# Patient Record
Sex: Male | Born: 2005 | Race: Black or African American | Hispanic: No | Marital: Single | Smoking: Never smoker
Health system: Southern US, Community
[De-identification: ages and names within clinical notes are randomized; demographics above are authoritative.]

## PROBLEM LIST (undated history)

## (undated) DIAGNOSIS — J45909 Unspecified asthma, uncomplicated: Secondary | ICD-10-CM

## (undated) DIAGNOSIS — J302 Other seasonal allergic rhinitis: Secondary | ICD-10-CM

---
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
## 2023-03-02 ENCOUNTER — Ambulatory Visit: Payer: Medicaid Other | Admitting: Allergy

## 2023-03-13 ENCOUNTER — Ambulatory Visit: Payer: Medicaid Other | Admitting: Allergy

## 2023-03-13 NOTE — Progress Notes (Deleted)
New Patient Note  RE: Nathan Bennett MRN: [id number] DOB: 07-17-2006 Date of Office Visit: 03/13/2023  Consult requested by: Alma Downs, MD Primary care provider: Alma Downs, MD  Chief Complaint: No chief complaint on file.  History of Present Illness: I had the pleasure of seeing Nathan Bennett for initial evaluation at the [facility] on 03/13/2023. He is a 17 y.o. male, who is referred here by Alma Downs, MD for the evaluation of allergic rhinitis.  He reports symptoms of ***. Symptoms have been going on for *** years. The symptoms are present *** all year around with worsening in ***. Other triggers include exposure to ***. Anosmia: ***. Headache: ***. He has used *** with ***fair improvement in symptoms. Sinus infections: ***. Previous work up includes: ***. Previous ENT evaluation: ***. Previous sinus imaging: ***. History of nasal polyps: ***. Last eye exam: ***. History of reflux: ***.  Assessment and Plan: Srihith is a 17 y.o. male with: No problem-specific Assessment & Plan notes found for this encounter.  No follow-ups on file.  No orders of the defined types were placed in this encounter.  Lab Orders  No laboratory test(s) ordered today    Other allergy screening: Asthma: {Blank single:19197::"yes","no"} Rhino conjunctivitis: {Blank single:19197::"yes","no"} Food allergy: {Blank single:19197::"yes","no"} Medication allergy: {Blank single:19197::"yes","no"} Hymenoptera allergy: {Blank single:19197::"yes","no"} Urticaria: {Blank single:19197::"yes","no"} Eczema:{Blank single:19197::"yes","no"} History of recurrent infections suggestive of immunodeficency: {Blank single:19197::"yes","no"}  Diagnostics: Spirometry:  Tracings reviewed. His effort: {Blank single:19197::"Good reproducible efforts.","It was hard to get consistent efforts and there is a question as to whether this reflects a maximal maneuver.","Poor effort, data can not be  interpreted."} FVC: ***L FEV1: ***L, ***% predicted FEV1/FVC ratio: ***% Interpretation: {Blank single:19197::"Spirometry consistent with mild obstructive disease","Spirometry consistent with moderate obstructive disease","Spirometry consistent with severe obstructive disease","Spirometry consistent with possible restrictive disease","Spirometry consistent with mixed obstructive and restrictive disease","Spirometry uninterpretable due to technique","Spirometry consistent with normal pattern","No overt abnormalities noted given today's efforts"}.  Please see scanned spirometry results for details.  Skin Testing: {Blank single:19197::"Select foods","Environmental allergy panel","Environmental allergy panel and select foods","Food allergy panel","None","Deferred due to recent antihistamines use"}. *** Results discussed with patient/family.   Past Medical History: There are no problems to display for this patient.  Past Medical History:  Diagnosis Date  . Asthma   . Seasonal allergies    Past Surgical History: No past surgical history on file. Medication List:  Current Outpatient Medications  Medication Sig Dispense Refill  . beclomethasone (QVAR) 80 MCG/ACT inhaler Inhale 2 puffs into the lungs 2 (two) times daily. 1 Inhaler 0  . cetirizine (ZYRTEC) 10 MG chewable tablet Chew 1 tablet (10 mg total) by mouth daily as needed for allergies.    Marland Kitchen metoCLOPramide (REGLAN) 10 MG tablet Take 1 tablet (10 mg total) by mouth every 6 (six) hours as needed for nausea (nausea/headache). (Patient not taking: Reported on 01/10/2022) 6 tablet 0  . montelukast (SINGULAIR) 5 MG chewable tablet Chew 1 tablet (5 mg total) by mouth at bedtime. 30 tablet 0  . predniSONE (DELTASONE) 10 MG tablet Take 4 tablets (40 mg total) by mouth daily. (Patient not taking: Reported on 01/10/2022) 16 tablet 0   No current facility-administered medications for this visit.   Allergies: No Known Allergies Social  History: Social History   Socioeconomic History  . Marital status: Single    Spouse name: Not on file  . Number of children: Not on file  . Years of education: Not on file  . Highest education level:  Not on file  Occupational History  . Not on file  Tobacco Use  . Smoking status: Never  . Smokeless tobacco: Never  Substance and Sexual Activity  . Alcohol use: Never  . Drug use: Never  . Sexual activity: Not on file  Other Topics Concern  . Not on file  Social History Narrative   In 12th grade at [employer].    Social Determinants of Health   Financial Resource Strain: Not on file  Food Insecurity: Not on file  Transportation Needs: Not on file  Physical Activity: Not on file  Stress: Not on file  Social Connections: Not on file   Lives in a ***. Smoking: *** Occupation: ***  Environmental HistorySurveyor, minerals in the house: Copywriter, advertising in the family room: {Blank single:19197::"yes","no"} Carpet in the bedroom: {Blank single:19197::"yes","no"} Heating: {Blank single:19197::"electric","gas","heat pump"} Cooling: {Blank single:19197::"central","window","heat pump"} Pet: {Blank single:19197::"yes ***","no"}  Family History: Family History  Problem Relation Age of Onset  . Diabetes type II Father   . Diabetes Other    Problem                               Relation Asthma                                   *** Eczema                                *** Food allergy                          *** Allergic rhino conjunctivitis     ***  Review of Systems  Constitutional:  Negative for appetite change, chills, fever and unexpected weight change.  HENT:  Negative for congestion and rhinorrhea.   Eyes:  Negative for itching.  Respiratory:  Negative for cough, chest tightness, shortness of breath and wheezing.   Cardiovascular:  Negative for chest pain.  Gastrointestinal:  Negative for abdominal pain.  Genitourinary:  Negative for  difficulty urinating.  Skin:  Negative for rash.  Neurological:  Negative for headaches.   Objective: There were no vitals taken for this visit. There is no height or weight on file to calculate BMI. Physical Exam Vitals and nursing note reviewed.  Constitutional:      Appearance: Normal appearance. He is well-developed.  HENT:     Head: Normocephalic and atraumatic.     Right Ear: Tympanic membrane and external ear normal.     Left Ear: Tympanic membrane and external ear normal.     Nose: Nose normal.     Mouth/Throat:     Mouth: Mucous membranes are moist.     Pharynx: Oropharynx is clear.  Eyes:     Conjunctiva/sclera: Conjunctivae normal.  Cardiovascular:     Rate and Rhythm: Normal rate and regular rhythm.     Heart sounds: Normal heart sounds. No murmur heard.    No friction rub. No gallop.  Pulmonary:     Effort: Pulmonary effort is normal.     Breath sounds: Normal breath sounds. No wheezing, rhonchi or rales.  Musculoskeletal:     Cervical back: Neck supple.  Skin:    General: Skin is warm.     Findings: No rash.  Neurological:  Mental Status: He is alert and oriented to person, place, and time.  Psychiatric:        Behavior: Behavior normal.  The plan was reviewed with the patient/family, and all questions/concerned were addressed.  It was my pleasure to see Kennieth today and participate in his care. Please feel free to contact me with any questions or concerns.  Sincerely,  Wyline Mood, DO Allergy & Immunology  [facility] office: [phone] [facility] office: [phone]

## 2023-05-28 NOTE — Progress Notes (Deleted)
New Patient Note  RE: Nathan Bennett MRN: [id number] DOB: 01-03-2006 Date of Office Visit: 05/29/2023  Consult requested by: Alma Downs, MD Primary care provider: Alma Downs, MD  Chief Complaint: No chief complaint on file.  History of Present Illness: I had the pleasure of seeing Nathan Bennett for initial evaluation at the [facility] on 05/28/2023. He is a 17 y.o. male, who is referred here by Alma Downs, MD for the evaluation of rhinorrhea.  He reports symptoms of ***. Symptoms have been going on for *** years. The symptoms are present *** all year around with worsening in ***. Other triggers include exposure to ***. Anosmia: ***. Headache: ***. He has used *** with ***fair improvement in symptoms. Sinus infections: ***. Previous work up includes: ***. Previous ENT evaluation: ***. Previous sinus imaging: ***. History of nasal polyps: ***. Last eye exam: ***. History of reflux: ***.  Assessment and Plan: Nathan Bennett is a 17 y.o. male with: ***  No follow-ups on file.  No orders of the defined types were placed in this encounter.  Lab Orders  No laboratory test(s) ordered today    Other allergy screening: Asthma: {Blank single:19197::"yes","no"} Rhino conjunctivitis: {Blank single:19197::"yes","no"} Food allergy: {Blank single:19197::"yes","no"} Medication allergy: {Blank single:19197::"yes","no"} Hymenoptera allergy: {Blank single:19197::"yes","no"} Urticaria: {Blank single:19197::"yes","no"} Eczema:{Blank single:19197::"yes","no"} History of recurrent infections suggestive of immunodeficency: {Blank single:19197::"yes","no"}  Diagnostics: Spirometry:  Tracings reviewed. His effort: {Blank single:19197::"Good reproducible efforts.","It was hard to get consistent efforts and there is a question as to whether this reflects a maximal maneuver.","Poor effort, data can not be interpreted."} FVC: ***L FEV1: ***L, ***% predicted FEV1/FVC ratio:  ***% Interpretation: {Blank single:19197::"Spirometry consistent with mild obstructive disease","Spirometry consistent with moderate obstructive disease","Spirometry consistent with severe obstructive disease","Spirometry consistent with possible restrictive disease","Spirometry consistent with mixed obstructive and restrictive disease","Spirometry uninterpretable due to technique","Spirometry consistent with normal pattern","No overt abnormalities noted given today's efforts"}.  Please see scanned spirometry results for details.  Skin Testing: {Blank single:19197::"Select foods","Environmental allergy panel","Environmental allergy panel and select foods","Food allergy panel","None","Deferred due to recent antihistamines use"}. *** Results discussed with patient/family.   Past Medical History: There are no problems to display for this patient.  Past Medical History:  Diagnosis Date  . Asthma   . Seasonal allergies    Past Surgical History: No past surgical history on file. Medication List:  Current Outpatient Medications  Medication Sig Dispense Refill  . beclomethasone (QVAR) 80 MCG/ACT inhaler Inhale 2 puffs into the lungs 2 (two) times daily. 1 Inhaler 0  . cetirizine (ZYRTEC) 10 MG chewable tablet Chew 1 tablet (10 mg total) by mouth daily as needed for allergies.    Marland Kitchen metoCLOPramide (REGLAN) 10 MG tablet Take 1 tablet (10 mg total) by mouth every 6 (six) hours as needed for nausea (nausea/headache). (Patient not taking: Reported on 01/10/2022) 6 tablet 0  . montelukast (SINGULAIR) 5 MG chewable tablet Chew 1 tablet (5 mg total) by mouth at bedtime. 30 tablet 0  . predniSONE (DELTASONE) 10 MG tablet Take 4 tablets (40 mg total) by mouth daily. (Patient not taking: Reported on 01/10/2022) 16 tablet 0   No current facility-administered medications for this visit.   Allergies: No Known Allergies Social History: Social History   Socioeconomic History  . Marital status: Single     Spouse name: Not on file  . Number of children: Not on file  . Years of education: Not on file  . Highest education level: Not on file  Occupational History  . Not on  file  Tobacco Use  . Smoking status: Never  . Smokeless tobacco: Never  Substance and Sexual Activity  . Alcohol use: Never  . Drug use: Never  . Sexual activity: Not on file  Other Topics Concern  . Not on file  Social History Narrative   In 12th grade at [employer].    Social Determinants of Health   Financial Resource Strain: Not on file  Food Insecurity: Not on file  Transportation Needs: Not on file  Physical Activity: Not on file  Stress: Not on file  Social Connections: Not on file   Lives in a ***. Smoking: *** Occupation: ***  Environmental HistorySurveyor, minerals in the house: Copywriter, advertising in the family room: {Blank single:19197::"yes","no"} Carpet in the bedroom: {Blank single:19197::"yes","no"} Heating: {Blank single:19197::"electric","gas","heat pump"} Cooling: {Blank single:19197::"central","window","heat pump"} Pet: {Blank single:19197::"yes ***","no"}  Family History: Family History  Problem Relation Age of Onset  . Diabetes type II Father   . Diabetes Other    Problem                               Relation Asthma                                   *** Eczema                                *** Food allergy                          *** Allergic rhino conjunctivitis     ***  Review of Systems  Constitutional:  Negative for appetite change, chills, fever and unexpected weight change.  HENT:  Negative for congestion and rhinorrhea.   Eyes:  Negative for itching.  Respiratory:  Negative for cough, chest tightness, shortness of breath and wheezing.   Cardiovascular:  Negative for chest pain.  Gastrointestinal:  Negative for abdominal pain.  Genitourinary:  Negative for difficulty urinating.  Skin:  Negative for rash.  Neurological:  Negative for  headaches.   Objective: There were no vitals taken for this visit. There is no height or weight on file to calculate BMI. Physical Exam Vitals and nursing note reviewed.  Constitutional:      Appearance: Normal appearance. He is well-developed.  HENT:     Head: Normocephalic and atraumatic.     Right Ear: Tympanic membrane and external ear normal.     Left Ear: Tympanic membrane and external ear normal.     Nose: Nose normal.     Mouth/Throat:     Mouth: Mucous membranes are moist.     Pharynx: Oropharynx is clear.  Eyes:     Conjunctiva/sclera: Conjunctivae normal.  Cardiovascular:     Rate and Rhythm: Normal rate and regular rhythm.     Heart sounds: Normal heart sounds. No murmur heard.    No friction rub. No gallop.  Pulmonary:     Effort: Pulmonary effort is normal.     Breath sounds: Normal breath sounds. No wheezing, rhonchi or rales.  Musculoskeletal:     Cervical back: Neck supple.  Skin:    General: Skin is warm.     Findings: No rash.  Neurological:     Mental Status: He is alert and oriented  to person, place, and time.  Psychiatric:        Behavior: Behavior normal.  The plan was reviewed with the patient/family, and all questions/concerned were addressed.  It was my pleasure to see Nathan Bennett today and participate in his care. Please feel free to contact me with any questions or concerns.  Sincerely,  Wyline Mood, DO Allergy & Immunology  [facility] office: [phone] [facility] office: [phone]

## 2023-05-29 ENCOUNTER — Encounter: Payer: Self-pay | Admitting: Internal Medicine

## 2023-05-29 ENCOUNTER — Ambulatory Visit: Payer: Medicaid Other | Admitting: Allergy

## 2023-05-29 ENCOUNTER — Ambulatory Visit (INDEPENDENT_AMBULATORY_CARE_PROVIDER_SITE_OTHER): Payer: Medicaid Other | Admitting: Internal Medicine

## 2023-05-29 VITALS — BP 102/66 | HR 68 | Temp 97.3°F | Resp 16 | Ht 68.0 in | Wt 115.7 lb

## 2023-05-29 DIAGNOSIS — J3089 Other allergic rhinitis: Secondary | ICD-10-CM

## 2023-05-29 DIAGNOSIS — J302 Other seasonal allergic rhinitis: Secondary | ICD-10-CM

## 2023-05-29 DIAGNOSIS — J4530 Mild persistent asthma, uncomplicated: Secondary | ICD-10-CM

## 2023-05-29 MED ORDER — MONTELUKAST SODIUM 10 MG PO TABS: 10.0000 mg | ORAL_TABLET | Freq: Every day | ORAL | 5 refills | Status: AC

## 2023-05-29 MED ORDER — CETIRIZINE HCL 10 MG PO TABS: 10.0000 mg | ORAL_TABLET | Freq: Every day | ORAL | 5 refills | Status: AC

## 2023-05-29 MED ORDER — FLUTICASONE PROPIONATE 50 MCG/ACT NA SUSP: 2.0000 | Freq: Every day | NASAL | 2 refills | Status: AC

## 2023-05-29 MED ORDER — AZELASTINE HCL 0.1 % NA SOLN: 2.0000 | Freq: Two times a day (BID) | NASAL | 12 refills | Status: AC

## 2023-05-29 NOTE — Patient Instructions (Signed)
Mild persistent asthma: Well controlled  - your lung testing today looked great!  PLAN:  - Spacer use reviewed. - Controller Inhaler: Continue Flovent 2 puffs once a day; This Should Be Used Everyday - Rinse mouth out after use - Rescue Inhaler: Albuterol (Proair/Ventolin) 2 puffs . Use  every 4-6 hours as needed for chest tightness, wheezing, or coughing.  Can also use 15 minutes prior to exercise if you have symptoms with activity. - Asthma is not controlled if:  - Symptoms are occurring >2 times a week OR  - >2 times a month nighttime awakenings  - You are requiring systemic steroids (prednisone/steroid injections) more than once per year  - Your require hospitalization for your asthma.  - Please call the clinic to schedule a follow up if these symptoms arise  Chronic Rhinitis Seasonal and Perennial Allergic: - allergy testing today: Grass, weed, tree, mold, dust mite, cat, dog, roach  - Prevention:  - allergen avoidance when possible - consider allergy shots as long term control of your symptoms by teaching your immune system to be more tolerant of your allergy triggers  - Symptom control: - Start Nasal Steroid Spray: Best results if used daily. - Options include Flonase (fluticasone), Nasocort (triamcinolone), Nasonex (mometasome) 1- 2 sprays in each nostril daily.  - All can be purchased over-the-counter if not covered by insurance. - Start Astelin (azelastine) 1-2 sprays in each nostril twice a day as needed for nasal congestion/itchy nose - Use less frequently if airway gets too dry. - Start Singulair (Montelukast) 10mg  nightly.   - Discontinue if nightmares of behavior changes. - Start Antihistamine: daily or daily as needed.   -Options include Zyrtec (Cetirizine) 10mg , Claritin (Loratadine) 10mg , Allegra (Fexofenadine) 180mg , or Xyzal (Levocetirinze) 5mg  - Can be purchased over-the-counter if not covered by insurance.  Follow up: 2 months   Thank you so much for letting  me partake in your care today.  Don't hesitate to reach out if you have any additional concerns!  Ferol Luz, MD  [facility] [place] [place]  Reducing Pollen Exposure  The American Academy of Allergy, Asthma and Immunology suggests the following steps to reduce your exposure to pollen during allergy seasons.    Do not hang sheets or clothing out to dry; pollen may collect on these items. Do not mow lawns or spend time around freshly cut grass; mowing stirs up pollen. Keep windows closed at night.  Keep car windows closed while driving. Minimize morning activities outdoors, a time when pollen counts are usually at their highest. Stay indoors as much as possible when pollen counts or humidity is high and on windy days when pollen tends to remain in the air longer. Use air conditioning when possible.  Many air conditioners have filters that trap the pollen spores. Use a HEPA room air filter to remove pollen form the indoor air you breathe.  DUST MITE AVOIDANCE MEASURES:  There are three main measures that need and can be taken to avoid house dust mites:  Reduce accumulation of dust in general -reduce furniture, clothing, carpeting, books, stuffed animals, especially in bedroom  Separate yourself from the dust -use pillow and mattress encasements (can be found at stores such as [employer] or online) -avoid direct exposure to air condition flow -use a HEPA filter device, especially in the bedroom; you can also use a HEPA filter vacuum cleaner -wipe dust with a moist towel instead of a dry towel or broom when cleaning  Decrease mites and/or their secretions -wash clothing and linen and stuffed animals at highest temperature possible, at least every 2 weeks -stuffed animals can also be placed in a bag and put in a freezer overnight  Despite the above measures, it is impossible to eliminate dust mites or their allergen completely from your home.  With the  above measures the burden of mites in your home can be diminished, with the goal of minimizing your allergic symptoms.  Success will be reached only when implementing and using all means together.  Control of Mold Allergen   Mold and fungi can grow on a variety of surfaces provided certain temperature and moisture conditions exist.  Outdoor molds grow on plants, decaying vegetation and soil.  The major outdoor mold, Alternaria and Cladosporium, are found in very high numbers during hot and dry conditions.  Generally, a late Summer - Fall peak is seen for common outdoor fungal spores.  Rain will temporarily lower outdoor mold spore count, but counts rise rapidly when the rainy period ends.  The most important indoor molds are Aspergillus and Penicillium.  Dark, humid and poorly ventilated basements are ideal sites for mold growth.  The next most common sites of mold growth are the bathroom and the kitchen.  Outdoor (Seasonal) Mold Control  Use air conditioning and keep windows closed Avoid exposure to decaying vegetation. Avoid leaf raking. Avoid grain handling. Consider wearing a face mask if working in moldy areas.    Indoor (Perennial) Mold Control   Maintain humidity below 50%. Clean washable surfaces with 5% bleach solution. Remove sources e.g. contaminated carpets.  Control of Cockroach Allergen  Cockroach allergen has been identified as an important cause of acute attacks of asthma, especially in urban settings.  There are fifty-five species of cockroach that exist in the [place], however only three, the [place], [place] species produce allergen that can affect patients with Asthma.  Allergens can be obtained from fecal particles, egg casings and secretions from cockroaches.    Remove food sources. Reduce access to water. Seal access and entry points. Spray runways with 0.5-1% Diazinon or Chlorpyrifos Blow boric acid power under stoves and refrigerator. Place  bait stations (hydramethylnon) at feeding sites.

## 2023-05-29 NOTE — Progress Notes (Signed)
New Patient Note  RE: Nathan Bennett MRN: [id number] DOB: 10-13-2005 Date of Office Visit: 05/29/2023  Consult requested by: Alma Downs, MD Primary care provider: Alma Downs, MD  Chief Complaint: Allergic Rhinitis  Marland KitchenAltamese Cabal nose and cough when indoors)  History of Present Illness: I had the pleasure of seeing Nathan Bennett for initial evaluation at the [facility] on 05/29/2023. He is a 17 y.o. male, who is referred here by Alma Downs, MD for the evaluation of chronic rhinitis .  History obtained from patient, chart review.  Chronic rhinitis: started 2 years ago  Symptoms include:  throat clearing cough, rhinorrhea, post nasal drainage, and sneezing  Occurs year-round with seasonal flares spring and summer Potential triggers: worse indoors, dogs, pollen, dust  Treatments tried: cetrizine, Careers adviser, flonase (inadequate trial, did not  tolerate),  Previous allergy testing: no History of reflux/heartburn: no History of chronic sinusitis or sinus surgery: no Nonallergic triggers: strong odors   Asthma:  Diagnosed at age as  a young child .  Current symptoms include chest tightness, cough, shortness of breath, and wheezing 0 daytime symptoms in past month, 0 nighttime awakenings in past month Using rescue inhaler 0 Limitations to daily activity: none 0 ED visits, 0 UC visits and 0 oral steroids in the past year 1 number of lifetime hospitalizations, 0 number of lifetime intubations.  Identified Triggers:  dust and heat  Prior PFTs or spirometry: none to review  Previously used therapies: albuterol, flovent 2 puffs daily .  Current regimen:  Maintenance: Flovent 2 puff daily  Rescue: Albuterol 2 puffs q4-6 hrs PRN, not using  prior to exercise  Up-to-date with pneumonia,, Covid-19,, and Flu, vaccines. History of prior pneumonias: no History of prior COVID-19/RVS/FLU  infection: no Smoking exposure: he is an active vaper, secondhand exposure at home       Assessment and Plan: Nathan Bennett is a 17 y.o. male with: Mild persistent asthma without complication - Plan: Spirometry with Graph  Seasonal and perennial allergic rhinitis - Plan: Allergy Test, Intradermal Allergy Test   Plan: Patient Instructions  Mild persistent asthma: Well controlled  - your lung testing today looked great!  PLAN:  - Spacer use reviewed. - Controller Inhaler: Continue Flovent 2 puffs once a day; This Should Be Used Everyday - Rinse mouth out after use - Rescue Inhaler: Albuterol (Proair/Ventolin) 2 puffs . Use  every 4-6 hours as needed for chest tightness, wheezing, or coughing.  Can also use 15 minutes prior to exercise if you have symptoms with activity. - Asthma is not controlled if:  - Symptoms are occurring >2 times a week OR  - >2 times a month nighttime awakenings  - You are requiring systemic steroids (prednisone/steroid injections) more than once per year  - Your require hospitalization for your asthma.  - Please call the clinic to schedule a follow up if these symptoms arise  Chronic Rhinitis Seasonal and Perennial Allergic: - allergy testing today: Grass, weed, tree, mold, dust mite, cat, dog, roach  - Prevention:  - allergen avoidance when possible - consider allergy shots as long term control of your symptoms by teaching your immune system to be more tolerant of your allergy triggers  - Symptom control: - Start Nasal Steroid Spray: Best results if used daily. - Options include Flonase (fluticasone), Nasocort (triamcinolone), Nasonex (mometasome) 1- 2 sprays in each nostril daily.  - All can be purchased over-the-counter if not covered by insurance. - Start Astelin (azelastine) 1-2 sprays in each  nostril twice a day as needed for nasal congestion/itchy nose - Use less frequently if airway gets too dry. - Start Singulair (Montelukast) 10mg  nightly.   - Discontinue if nightmares of behavior changes. - Start Antihistamine: daily or daily as  needed.   -Options include Zyrtec (Cetirizine) 10mg , Claritin (Loratadine) 10mg , Allegra (Fexofenadine) 180mg , or Xyzal (Levocetirinze) 5mg  - Can be purchased over-the-counter if not covered by insurance.  Follow up: 2 months   Thank you so much for letting me partake in your care today.  Don't hesitate to reach out if you have any additional concerns!  Nathan Luz, MD  [facility] [place] [place]  Reducing Pollen Exposure  The American Academy of Allergy, Asthma and Immunology suggests the following steps to reduce your exposure to pollen during allergy seasons.    Do not hang sheets or clothing out to dry; pollen may collect on these items. Do not mow lawns or spend time around freshly cut grass; mowing stirs up pollen. Keep windows closed at night.  Keep car windows closed while driving. Minimize morning activities outdoors, a time when pollen counts are usually at their highest. Stay indoors as much as possible when pollen counts or humidity is high and on windy days when pollen tends to remain in the air longer. Use air conditioning when possible.  Many air conditioners have filters that trap the pollen spores. Use a HEPA room air filter to remove pollen form the indoor air you breathe.  DUST MITE AVOIDANCE MEASURES:  There are three main measures that need and can be taken to avoid house dust mites:  Reduce accumulation of dust in general -reduce furniture, clothing, carpeting, books, stuffed animals, especially in bedroom  Separate yourself from the dust -use pillow and mattress encasements (can be found at stores such as [employer] or online) -avoid direct exposure to air condition flow -use a HEPA filter device, especially in the bedroom; you can also use a HEPA filter vacuum cleaner -wipe dust with a moist towel instead of a dry towel or broom when cleaning  Decrease mites and/or their secretions -wash clothing and linen and stuffed animals  at highest temperature possible, at least every 2 weeks -stuffed animals can also be placed in a bag and put in a freezer overnight  Despite the above measures, it is impossible to eliminate dust mites or their allergen completely from your home.  With the above measures the burden of mites in your home can be diminished, with the goal of minimizing your allergic symptoms.  Success will be reached only when implementing and using all means together.  Control of Mold Allergen   Mold and fungi can grow on a variety of surfaces provided certain temperature and moisture conditions exist.  Outdoor molds grow on plants, decaying vegetation and soil.  The major outdoor mold, Alternaria and Cladosporium, are found in very high numbers during hot and dry conditions.  Generally, a late Summer - Fall peak is seen for common outdoor fungal spores.  Rain will temporarily lower outdoor mold spore count, but counts rise rapidly when the rainy period ends.  The most important indoor molds are Aspergillus and Penicillium.  Dark, humid and poorly ventilated basements are ideal sites for mold growth.  The next most common sites of mold growth are the bathroom and the kitchen.  Outdoor (Seasonal) Mold Control  Use air conditioning and keep windows closed Avoid exposure to decaying vegetation. Avoid leaf raking. Avoid grain handling. Consider  wearing a face mask if working in moldy areas.    Indoor (Perennial) Mold Control   Maintain humidity below 50%. Clean washable surfaces with 5% bleach solution. Remove sources e.g. contaminated carpets.  Control of Cockroach Allergen  Cockroach allergen has been identified as an important cause of acute attacks of asthma, especially in urban settings.  There are fifty-five species of cockroach that exist in the [place], however only three, the [place], [place] species produce allergen that can affect patients with Asthma.  Allergens can be obtained from  fecal particles, egg casings and secretions from cockroaches.    Remove food sources. Reduce access to water. Seal access and entry points. Spray runways with 0.5-1% Diazinon or Chlorpyrifos Blow boric acid power under stoves and refrigerator. Place bait stations (hydramethylnon) at feeding sites.    Meds ordered this encounter  Medications   montelukast (SINGULAIR) 10 MG tablet    Sig: Take 1 tablet (10 mg total) by mouth at bedtime.    Dispense:  30 tablet    Refill:  5   fluticasone (FLONASE) 50 MCG/ACT nasal spray    Sig: Place 2 sprays into both nostrils daily.    Dispense:  16 g    Refill:  2   azelastine (ASTELIN) 0.1 % nasal spray    Sig: Place 2 sprays into both nostrils 2 (two) times daily. Use in each nostril as directed    Dispense:  30 mL    Refill:  12   cetirizine (ZYRTEC) 10 MG tablet    Sig: Take 1 tablet (10 mg total) by mouth daily.    Dispense:  30 tablet    Refill:  5   Lab Orders  No laboratory test(s) ordered today    Other allergy screening: Asthma: no Rhino conjunctivitis: yes Food allergy: no Medication allergy: no Hymenoptera allergy: no Urticaria: no Eczema:no History of recurrent infections suggestive of immunodeficency: no  Diagnostics: Spirometry:  Tracings reviewed. His effort: Good reproducible efforts. FVC: 3.74 L FEV1: 3.45 L, 106% predicted FEV1/FVC ratio: 92% Interpretation: Spirometry consistent with normal pattern.  Please see scanned spirometry results for details.  Skin Testing: Environmental allergy panel.  adequate controls  Results interpreted by myself and discussed with patient/family.  Airborne Adult Perc - 05/29/23 1032     Time Antigen Placed 1030    Allergen Manufacturer Greer    Location Back    Number of Test 55    1. Control-Buffer 50% Glycerol Negative    2. Control-Histamine 4+    3. Bahia 4+    4. [place] 4+    5. Johnson 4+    6. Kentucky Blue 4+    7. Meadow Fescue 4+    8. Perennial Rye 4+     9. Timothy 4+    10. Ragweed Mix Negative    11. Cocklebur Negative    12. Plantain,  English 2+    13. Baccharis 2+    14. Dog Fennel 2+    15. Gu[place] Thistle 2+    16. Lamb's Quarters 2+    17. Sheep Sorrell 2+    18. Rough Pigweed 2+    19. Marsh Elder, Rough 2+    20. Mugwort, Common 2+    21. Box, Elder 2+    22. Cedar, red 4+    23. Sweet Gum Negative    24. Pecan Pollen 4+    25. Pine Mix Negative    26. Walnut, Black Pollen 4+  27. Red Mulberry Negative    28. Ash Mix Negative    29. Birch Mix 4+    30. Beech American Negative    31. Cottonwood, [place] Negative    32. Hickory, White Negative    33. Maple Mix Negative    34. Oak, [place] Mix 4+    35. Sycamore Eastern 3+    36. Alternaria Alternata Negative    37. Cladosporium Herbarum Negative    38. Aspergillus Mix Negative    39. Penicillium Mix Negative    40. Bipolaris Sorokiniana (Helminthosporium) Negative    41. Drechslera Spicifera (Curvularia) Negative    42. Mucor Plumbeus Negative    43. Fusarium Moniliforme Negative    44. Aureobasidium Pullulans (pullulara) Negative    45. Rhizopus Oryzae Negative    46. Botrytis Cinera Negative    47. Epicoccum Nigrum Negative    48. Phoma Betae Negative    49. Dust Mite Mix Negative    50. Cat Hair 10,000 BAU/ml Negative    51.  Dog Epithelia Negative    52. Mixed Feathers Negative    53. Horse Epithelia Negative    54. Cockroach, German Negative    55. Tobacco Leaf Negative             Intradermal - 05/29/23 1034     Time Antigen Placed 1035    Allergen Manufacturer Waynette Buttery    Location Arm    Number of Test 10    Control Negative    Ragweed Mix 4+    Mold 1 3+    Mold 2 3+    Mold 3 Negative    Mold 4 3+    Mite Mix 3+    Cat 3+    Dog 3+    Cockroach 3+             Past Medical History: There are no problems to display for this patient.  Past Medical History:  Diagnosis Date   Asthma    Seasonal allergies    Past  Surgical History: History reviewed. No pertinent surgical history. Medication List:  Current Outpatient Medications  Medication Sig Dispense Refill   azelastine (ASTELIN) 0.1 % nasal spray Place 2 sprays into both nostrils 2 (two) times daily. Use in each nostril as directed 30 mL 12   cetirizine (ZYRTEC) 10 MG tablet Take 1 tablet (10 mg total) by mouth daily. 30 tablet 5   fluticasone (FLONASE) 50 MCG/ACT nasal spray Place 2 sprays into both nostrils daily. 16 g 2   montelukast (SINGULAIR) 10 MG tablet Take 1 tablet (10 mg total) by mouth at bedtime. 30 tablet 5   No current facility-administered medications for this visit.   Allergies: No Known Allergies Social History: Social History   Socioeconomic History   Marital status: Single    Spouse name: Not on file   Number of children: Not on file   Years of education: Not on file   Highest education level: Not on file  Occupational History   Not on file  Tobacco Use   Smoking status: Never   Smokeless tobacco: Never  Vaping Use   Vaping status: Some Days  Substance and Sexual Activity   Alcohol use: Never   Drug use: Never   Sexual activity: Not on file  Other Topics Concern   Not on file  Social History Narrative   In 12th grade at [employer].    Social Determinants of Health   Financial Resource Strain: Not on  file  Food Insecurity: Not on file  Transportation Needs: Not on file  Physical Activity: Not on file  Stress: Not on file  Social Connections: Not on file   Lives in a single-family home that is 19 years old.  No roaches in the house and bed is to get off the floor.  Dust mite precautions on bed and pillows.  Not exposed to fumes, chemicals or dust.  [place] not [place] an interstate industrial area. Smoking: No exposure Occupation: Architectural technologist History: Immunologist in the house: no Engineer, civil (consulting) in the family room: no Carpet in the bedroom: yes Heating: gas Cooling: central Pet:  no  Family History: Family History  Problem Relation Age of Onset   Diabetes type II Father    Diabetes Other      ROS: All others negative except as noted per HPI.   Objective: BP 102/66   Pulse 68   Temp (!) 97.3 F (36.3 C) (Temporal)   Resp 16   Ht 5\' 8"  (1.727 m)   Wt 115 lb 11.2 oz (52.5 kg)   SpO2 99%   BMI 17.59 kg/m  Body mass index is 17.59 kg/m.  General Appearance:  Alert, cooperative, no distress, appears stated age  Head:  Normocephalic, without obvious abnormality, atraumatic  Eyes:  Conjunctiva clear, EOM's intact  Nose: Nares normal,  Pale edematous mucosa with clear rhinnorhea , no visible anterior polyps, and septum midline  Throat: Lips, tongue normal; teeth and gums normal, + cobblestoning  Neck: Supple, symmetrical  Lungs:   clear to auscultation bilaterally, Respirations unlabored, no coughing  Heart:  regular rate and rhythm and no murmur, Appears well perfused  Extremities: No edema  Skin: Skin color, texture, turgor normal, no rashes or lesions on visualized portions of skin  Neurologic: No gross deficits   The plan was reviewed with the patient/family, and all questions/concerned were addressed.  It was my pleasure to see Nathan Bennett today and participate in his care. Please feel free to contact me with any questions or concerns.  Sincerely,  Nathan Luz, MD Allergy & Immunology  [facility] office: [phone] [facility] office: [phone]

## 2023-07-30 ENCOUNTER — Ambulatory Visit: Payer: Medicaid Other | Admitting: Internal Medicine
